# Patient Record
Sex: Male | Born: 2000 | Race: White | Hispanic: No | Marital: Single | Smoking: Never smoker
Health system: Southern US, Community
[De-identification: ages and names within clinical notes are randomized; demographics above are authoritative.]

---
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
## 2019-12-11 ENCOUNTER — Emergency Department (HOSPITAL_COMMUNITY)
Admission: EM | Admit: 2019-12-11 | Discharge: 2019-12-12 | Disposition: A | Discharge status: Home / Self Care | Payer: Medicaid Other | Attending: Emergency Medicine | Admitting: Emergency Medicine

## 2019-12-11 ENCOUNTER — Other Ambulatory Visit: Payer: Self-pay

## 2019-12-11 DIAGNOSIS — F329 Major depressive disorder, single episode, unspecified: Secondary | ICD-10-CM | POA: Diagnosis not present

## 2019-12-11 DIAGNOSIS — T50902A Poisoning by unspecified drugs, medicaments and biological substances, intentional self-harm, initial encounter: Secondary | ICD-10-CM | POA: Diagnosis present

## 2019-12-11 DIAGNOSIS — R45851 Suicidal ideations: Secondary | ICD-10-CM | POA: Diagnosis not present

## 2019-12-11 DIAGNOSIS — Z20828 Contact with and (suspected) exposure to other viral communicable diseases: Secondary | ICD-10-CM | POA: Diagnosis not present

## 2019-12-11 LAB — CBG MONITORING, ED: Glucose-Capillary: 88 mg/dL (ref 70–99)

## 2019-12-11 MED ORDER — CHARCOAL ACTIVATED PO LIQD
50.0000 g | Freq: Once | ORAL | Status: AC
  Administered 2019-12-11: 50 g via ORAL
  Filled 2019-12-11: qty 240

## 2019-12-11 NOTE — ED Notes (Signed)
Poison control notified, recommended charcoal and to monitor for 6-8 hours. Potential for sedation, nausea, HTN, QTC prolongation and seizures. Check a tylenol level as well and if QTC is >500 recommended optimizing K and Mg and can give benzos for seizures. Spoke with Patty.

## 2019-12-11 NOTE — ED Notes (Signed)
Pt made aware urine sample needed 

## 2019-12-11 NOTE — ED Triage Notes (Addendum)
Pt presents to ED from home stating he took about 1500 mg of fluoxetine in an attempt to kill himself around 2000 but then began to feel guilty for what it would do to his friends and family and decided to seek help. Denies attempt before and states he hasn't thrown any of them back up. Pt is ambulatory and CA&Ox4 at this time.

## 2019-12-11 NOTE — ED Provider Notes (Signed)
5:43 AM Assumed care from Dr. Wilson Singer, please see their note for full history, physical and decision making until this point. In brief this is a 18 y.o. year old male who presented to the ED tonight with Drug Overdose and Suicide Attempt     1500 mg prozac, charcoaled. Asymptomatic. 8 hour obs, 0400.   Still asymptomatic. Improving MS. Wants to go home but not forceable. Still no need for IVC, understands need to stay for further observation and TTS consult. Would IVC if still wants to leave.   Labs, studies and imaging reviewed by myself and considered in medical decision making if ordered. Imaging interpreted by radiology.  Labs Reviewed  ACETAMINOPHEN LEVEL - Abnormal; Notable for the following components:      Result Value   Acetaminophen (Tylenol), Serum <10 (*)    All other components within normal limits  RAPID URINE DRUG SCREEN, HOSP PERFORMED - Abnormal; Notable for the following components:   Tetrahydrocannabinol POSITIVE (*)    All other components within normal limits  RESPIRATORY PANEL BY RT PCR (FLU A&B, COVID)  COMPREHENSIVE METABOLIC PANEL  ETHANOL  SALICYLATE LEVEL  CBC  MAGNESIUM  CBG MONITORING, ED    No orders to display    No follow-ups on file.    Shyloh Krinke, Corene Cornea, MD 12/12/19 [phone]

## 2019-12-11 NOTE — ED Provider Notes (Signed)
[facility] DEPT Provider Note   CSN: [id number] Arrival date & time: 12/11/19  2044     History Chief Complaint  Patient presents with  . Drug Overdose  . Suicide Attempt    John Mcdaniel is a 18 y.o. male.  HPI   18 year old male with intentional drug overdose.  He estimates that he took approximately 1500 mg of fluoxetine.  Time of ingestion around 8:00.  He began to feel guilty about the repercussions so then told family.  States that he feels mildly lightheaded but is otherwise asymptomatic.  He denies any other ingestions.  He is not on any other medications.  He reports that he is otherwise healthy.  Reports increasing stress related to school.  No past medical history on file.  There are no problems to display for this patient.   No past surgical history on file.     No family history on file.  Social History   Tobacco Use  . Smoking status: Never Smoker  . Smokeless tobacco: Never Used  Substance Use Topics  . Alcohol use: No  . Drug use: No    Home Medications Prior to Admission medications   Medication Sig Start Date End Date Taking? Authorizing Provider  cariprazine (VRAYLAR) capsule Take 1.5 mg by mouth daily.   Yes [provider]  FLUoxetine (PROZAC) 40 MG capsule Take 40 mg by mouth daily. 09/19/19  Yes [provider]  PRESCRIPTION MEDICATION Take 1 tablet by mouth daily. Tremor Medication   Yes [provider]    Allergies    Other and Peanuts [peanut oil]  Review of Systems   Review of Systems All systems reviewed and negative, other than as noted in HPI.  Physical Exam Updated Vital Signs BP 108/72   Pulse 85   Temp 99.7 F (37.6 C) (Oral)   Resp 11   Ht 5\' 5"  (1.651 m)   Wt 59 kg   SpO2 100%   BMI 21.63 kg/m   Physical Exam Vitals and nursing note reviewed.  Constitutional:      General: He is not in acute distress.    Appearance: He is well-developed.  HENT:     Head: Normocephalic and atraumatic.  Eyes:     General:        Right eye: No discharge.        Left eye: No discharge.     Conjunctiva/sclera: Conjunctivae normal.  Cardiovascular:     Rate and Rhythm: Normal rate and regular rhythm.     Heart sounds: Normal heart sounds. No murmur. No friction rub. No gallop.   Pulmonary:     Effort: Pulmonary effort is normal. No respiratory distress.     Breath sounds: Normal breath sounds.  Abdominal:     General: There is no distension.     Palpations: Abdomen is soft.     Tenderness: There is no abdominal tenderness.  Musculoskeletal:        General: No tenderness.     Cervical back: Neck supple.  Skin:    General: Skin is warm and dry.  Neurological:     Mental Status: He is alert.  Psychiatric:     Comments: Calm. Cooperative. Fair eye contact. Flat affect.      ED Results / Procedures / Treatments   Labs (all labs ordered are listed, but only abnormal results are displayed) Labs Reviewed  RESPIRATORY PANEL BY RT PCR (FLU A&B, COVID)  COMPREHENSIVE METABOLIC PANEL  ETHANOL  SALICYLATE LEVEL  ACETAMINOPHEN LEVEL  CBC  RAPID URINE DRUG SCREEN, HOSP PERFORMED  MAGNESIUM  CBG MONITORING, ED    EKG EKG Interpretation  Date/Time:  Wednesday December 11 2019 21:17:43 EST Ventricular Rate:  82 PR Interval:    QRS Duration: 87 QT Interval:  354 QTC Calculation: 414 R Axis:   84 Text Interpretation: Sinus rhythm Confirmed by Raeford Razor [phone]) on 12/11/2019 10:15:24 PM   Radiology No results found.  Procedures Procedures (including critical care time)  CRITICAL CARE Performed by: Raeford Razor Total critical care time: 35 minutes Critical care time was exclusive of separately billable procedures and treating other patients. Critical care was necessary to treat or prevent imminent or life-threatening deterioration. Critical care was time spent personally by me on the following activities: development of treatment  plan with patient and/or surrogate as well as nursing, discussions with consultants, evaluation of patient's response to treatment, examination of patient, obtaining history from patient or surrogate, ordering and performing treatments and interventions, ordering and review of laboratory studies, ordering and review of radiographic studies, pulse oximetry and re-evaluation of patient's condition.   Medications Ordered in ED Medications  charcoal activated (NO SORBITOL) (ACTIDOSE-AQUA) suspension 50 g (50 g Oral Given 12/11/19 2108)    ED Course  I have reviewed the triage vital signs and the nursing notes.  Pertinent labs & imaging results that were available during my care of the patient were reviewed by me and considered in my medical decision making (see chart for details).    MDM Rules/Calculators/A&P                      18yM with intentional drug overdose. Thankfully, SSRIs general well tolerated even in massive overdoses. Very little in terms of symptoms. Charcoal given with timing of ingestion. HD stable. Initial EKG ok. Will continue to monitor. Will be medically cleared if no significant symptoms by 4:00 am and labs are fine.    Final Clinical Impression(s) / ED Diagnoses Final diagnoses:  Intentional drug overdose, initial encounter [facility])  Suicidal ideation    Rx / DC Orders ED Discharge Orders    None       Raeford Razor, MD 12/11/19 2241

## 2019-12-12 ENCOUNTER — Other Ambulatory Visit: Payer: Self-pay | Admitting: Behavioral Health

## 2019-12-12 ENCOUNTER — Observation Stay (HOSPITAL_COMMUNITY)
Admission: AD | Admit: 2019-12-12 | Discharge: 2019-12-12 | Disposition: A | Discharge status: Other Acute Inpatient Hospital | Payer: Medicaid Other | Source: Intra-hospital | Attending: Psychiatry | Admitting: Psychiatry

## 2019-12-12 ENCOUNTER — Inpatient Hospital Stay (HOSPITAL_COMMUNITY)
Admission: AD | Admit: 2019-12-12 | Discharge: 2019-12-14 | DRG: 885 | Disposition: A | Discharge status: Home / Self Care | Payer: Medicaid Other | Source: Intra-hospital | Attending: Psychiatry | Admitting: Psychiatry

## 2019-12-12 ENCOUNTER — Other Ambulatory Visit: Payer: Self-pay

## 2019-12-12 ENCOUNTER — Encounter (HOSPITAL_COMMUNITY): Payer: Self-pay | Admitting: Psychiatry

## 2019-12-12 DIAGNOSIS — F419 Anxiety disorder, unspecified: Secondary | ICD-10-CM | POA: Diagnosis present

## 2019-12-12 DIAGNOSIS — T43222A Poisoning by selective serotonin reuptake inhibitors, intentional self-harm, initial encounter: Secondary | ICD-10-CM | POA: Diagnosis present

## 2019-12-12 DIAGNOSIS — F332 Major depressive disorder, recurrent severe without psychotic features: Secondary | ICD-10-CM

## 2019-12-12 DIAGNOSIS — G4700 Insomnia, unspecified: Secondary | ICD-10-CM | POA: Diagnosis present

## 2019-12-12 DIAGNOSIS — T50902A Poisoning by unspecified drugs, medicaments and biological substances, intentional self-harm, initial encounter: Secondary | ICD-10-CM | POA: Diagnosis not present

## 2019-12-12 DIAGNOSIS — F329 Major depressive disorder, single episode, unspecified: Secondary | ICD-10-CM | POA: Diagnosis present

## 2019-12-12 LAB — MAGNESIUM: Magnesium: 2.2 mg/dL (ref 1.7–2.4)

## 2019-12-12 LAB — RESPIRATORY PANEL BY RT PCR (FLU A&B, COVID)
Influenza A by PCR: NEGATIVE
Influenza B by PCR: NEGATIVE
SARS Coronavirus 2 by RT PCR: NEGATIVE

## 2019-12-12 LAB — COMPREHENSIVE METABOLIC PANEL
ALT: 15 U/L (ref 0–44)
AST: 20 U/L (ref 15–41)
Albumin: 4.7 g/dL (ref 3.5–5.0)
Alkaline Phosphatase: 90 U/L (ref 38–126)
Anion gap: 12 (ref 5–15)
BUN: 9 mg/dL (ref 6–20)
CO2: 27 mmol/L (ref 22–32)
Calcium: 9.4 mg/dL (ref 8.9–10.3)
Chloride: 102 mmol/L (ref 98–111)
Creatinine, Ser: 0.61 mg/dL (ref 0.61–1.24)
GFR calc Af Amer: >60 mL/min (ref >60)
GFR calc non Af Amer: >60 mL/min (ref >60)
Glucose, Bld: 91 mg/dL (ref 70–99)
Potassium: 3.7 mmol/L (ref 3.5–5.1)
Sodium: 141 mmol/L (ref 135–145)
Total Bilirubin: 0.9 mg/dL (ref 0.3–1.2)
Total Protein: 7.8 g/dL (ref 6.5–8.1)

## 2019-12-12 LAB — CBC
HCT: 43 % (ref 39.0–52.0)
Hemoglobin: 14.8 g/dL (ref 13.0–17.0)
MCH: 30.1 pg (ref 26.0–34.0)
MCHC: 34.4 g/dL (ref 30.0–36.0)
MCV: 87.6 fL (ref 80.0–100.0)
Platelets: 270 10*3/uL (ref 150–400)
RBC: 4.91 MIL/uL (ref 4.22–5.81)
RDW: 11.8 % (ref 11.5–15.5)
WBC: 7.1 10*3/uL (ref 4.0–10.5)
nRBC: 0 % (ref 0.0–0.2)

## 2019-12-12 LAB — ETHANOL: Alcohol, Ethyl (B): <10 mg/dL (ref <10)

## 2019-12-12 LAB — ACETAMINOPHEN LEVEL: Acetaminophen (Tylenol), Serum: <10 ug/mL — ABNORMAL LOW (ref 10–30)

## 2019-12-12 LAB — RAPID URINE DRUG SCREEN, HOSP PERFORMED
Amphetamines: NOT DETECTED
Barbiturates: NOT DETECTED
Benzodiazepines: NOT DETECTED
Cocaine: NOT DETECTED
Opiates: NOT DETECTED
Tetrahydrocannabinol: POSITIVE — AB

## 2019-12-12 LAB — SALICYLATE LEVEL: Salicylate Lvl: <7 mg/dL (ref 2.8–30.0)

## 2019-12-12 MED ORDER — ONDANSETRON HCL 4 MG PO TABS: 4.0000 mg | ORAL_TABLET | Freq: Three times a day (TID) | ORAL | Status: DC | PRN

## 2019-12-12 MED ORDER — ALUM & MAG HYDROXIDE-SIMETH 200-200-20 MG/5ML PO SUSP: 30.0000 mL | ORAL | Status: DC | PRN

## 2019-12-12 MED ORDER — MAGNESIUM HYDROXIDE 400 MG/5ML PO SUSP: 30.0000 mL | Freq: Every day | ORAL | Status: DC | PRN

## 2019-12-12 MED ORDER — ACETAMINOPHEN 325 MG PO TABS: 650.0000 mg | ORAL_TABLET | ORAL | Status: DC | PRN

## 2019-12-12 MED ORDER — LACTATED RINGERS IV BOLUS
1000.0000 mL | Freq: Once | INTRAVENOUS | Status: AC
  Administered 2019-12-12: 02:00:00 1000 mL via INTRAVENOUS

## 2019-12-12 MED ORDER — ACETAMINOPHEN 325 MG PO TABS: 650.0000 mg | ORAL_TABLET | Freq: Four times a day (QID) | ORAL | Status: DC | PRN

## 2019-12-12 MED ORDER — CARIPRAZINE HCL 1.5 MG PO CAPS: 1.5000 mg | ORAL_CAPSULE | Freq: Every day | ORAL | Status: DC

## 2019-12-12 MED ORDER — HYDROXYZINE HCL 25 MG PO TABS: 25.0000 mg | ORAL_TABLET | Freq: Three times a day (TID) | ORAL | Status: DC | PRN

## 2019-12-12 MED ORDER — CARIPRAZINE HCL 1.5 MG PO CAPS
1.5000 mg | ORAL_CAPSULE | Freq: Every day | ORAL | Status: DC
  Filled 2019-12-12 (×3): qty 1

## 2019-12-12 MED ORDER — TRAZODONE HCL 50 MG PO TABS: 50.0000 mg | ORAL_TABLET | Freq: Every evening | ORAL | Status: DC | PRN

## 2019-12-12 MED ORDER — FLUOXETINE HCL 20 MG PO CAPS
40.0000 mg | ORAL_CAPSULE | Freq: Every day | ORAL | Status: DC
  Filled 2019-12-12 (×3): qty 2

## 2019-12-12 MED ORDER — HYDROXYZINE HCL 25 MG PO TABS
25.0000 mg | ORAL_TABLET | Freq: Three times a day (TID) | ORAL | Status: DC | PRN
  Administered 2019-12-12: 25 mg via ORAL
  Filled 2019-12-12 (×2): qty 1

## 2019-12-12 MED ORDER — CARIPRAZINE HCL 1.5 MG PO CAPS
1.5000 mg | ORAL_CAPSULE | ORAL | Status: DC
  Administered 2019-12-12 – 2019-12-13 (×2): 1.5 mg via ORAL
  Filled 2019-12-12 (×5): qty 1

## 2019-12-12 MED ORDER — TRAZODONE HCL 50 MG PO TABS
50.0000 mg | ORAL_TABLET | Freq: Every evening | ORAL | Status: DC | PRN
  Administered 2019-12-13: 22:00:00 50 mg via ORAL
  Filled 2019-12-12: qty 1

## 2019-12-12 NOTE — ED Notes (Signed)
Spoke with Magda Paganini at [employer] [facility]. Patient is now cleared by Poison Control.

## 2019-12-12 NOTE — ED Notes (Signed)
Report called to [place] at [facility]. [facility] contacted to get patient.
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
## 2019-12-12 NOTE — BH Assessment (Signed)
Clinician contacted pt's father with the verbal consent from pt to obtain collateral information. Pt's father shares the incident with pt yesterday came abruptly and that, 30-60 minutes prior, pt "seemed fine." Pt's father states he is not worried about pt being a danger to himself, as he and his wife do not work and they are able to ensure pt's safety. Pt's father was unable to identify any triggers that could have resulted in pt attempting to kill himself, though he was able to identify that pt being cooped up has been difficult for him. He states pt did mention that he has been thinking about his mother, who died in May 09, 2011 by suicide.  Pt's father noted that, when pt came to tell him that he had taken too much medication, he did not express any distress or that he was in any hurry regarding what he did. Pt's father states pt told him, "I messed up--I think I took too much of my medicine."  Pt's father shares pt takes Fluoxetine 40mg  and Hydroxyzine 10mg   Leoncio Hansen, pt's father: [phone]
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
## 2019-12-12 NOTE — Progress Notes (Signed)
   12/12/19 2032  Psych Admission Type (Psych Patients Only)  Admission Status Voluntary  Psychosocial Assessment  Patient Complaints None  Eye Contact Brief  Facial Expression Flat  Affect Anxious;Sullen  Speech Logical/coherent  Interaction Assertive  Motor Activity Other (Comment) (WNL)  Appearance/Hygiene Unremarkable  Behavior Characteristics Cooperative;Anxious  Thought Process  Coherency WDL  Content WDL  Delusions WDL;None reported or observed  Perception WDL  Hallucination None reported or observed  Confusion None  Danger to Self  Current suicidal ideation? Denies  Danger to Others  Danger to Others None reported or observed   Pt is calm and pleasant. Pt asks for something for sleep. Given meds and also nighttime meds. Pt denies SI,HI, AVH and pain.

## 2019-12-12 NOTE — Plan of Care (Signed)
  Problem: Education: Goal: Emotional status will improve Outcome: Progressing Goal: Mental status will improve Outcome: Progressing   Problem: Activity: Goal: Interest or engagement in activities will improve Outcome: Progressing Goal: Sleeping patterns will improve Outcome: Progressing   Problem: Safety: Goal: Periods of time without injury will increase Outcome: Progressing   

## 2019-12-12 NOTE — ED Notes (Addendum)
Spoke with patient's father regarding transfer to [facility]. Per patients request, writer Gave room # and phone # for patient at [facility] to the father.

## 2019-12-12 NOTE — ED Notes (Signed)
Writer obtained consent for [facility], faxed over to [facility]

## 2019-12-12 NOTE — Tx Team (Signed)
Initial Treatment Plan 12/12/2019 3:51 PM [name] EYE:[id number]    PATIENT STRESSORS: Health problems Medication change or noncompliance Occupational concerns   PATIENT STRENGTHS: Ability for insight Average or above average intelligence Communication skills Supportive family/friends   PATIENT IDENTIFIED PROBLEMS: "Work on myself"  "Not have have SI thoughts"  Depression  Suicidal Ideation               DISCHARGE CRITERIA:  Ability to meet basic life and health needs Adequate post-discharge living arrangements Motivation to continue treatment in a less acute level of care  PRELIMINARY DISCHARGE PLAN: Attend aftercare/continuing care group Outpatient therapy Return to previous living arrangement  PATIENT/FAMILY INVOLVEMENT: This treatment plan has been presented to and reviewed with the patient, John Mcdaniel, and/or family member.  The patient and family have been given the opportunity to ask questions and make suggestions.  Coralyn Mark [name], RN 12/12/2019, 3:51 PM

## 2019-12-12 NOTE — Progress Notes (Signed)
Admission Note: Patient is a 18 year old male admitted to the observation unit for intentional overdose on his fluoxetine.  Reports being overwhelmed with school and worrying about his future.  Patient currently denies suicidal ideation and verbally contracts for safety while in the hospital.  States goal is working on himself and to stop having suicidal thoughts.  Admission plan of care reviewed and consent signed.  Skin assessment and personal belongings completed.  Skin is dry and intact.  No contraband found.  Patient oriented to the unit, staff and room.  Verbalizes understanding of unit rules and protocol.  Patient is safe on the unit.

## 2019-12-12 NOTE — ED Notes (Signed)
ED TO INPATIENT HANDOFF REPORT  ED Nurse Name and Phone #:  Fahima Cifelli [phone]  S Name/Age/Gender Forestine Na 18 y.o. male Room/Bed: WA29/WA29  Code Status   Code Status: Full Code  Home/SNF/Other Home Patient oriented to: self, place, time and situation Is this baseline? Yes   Triage Complete: Triage complete  Chief Complaint Medication Overdose  Triage Note Pt presents to ED from home stating he took about 1500 mg of fluoxetine in an attempt to kill himself around 2000 but then began to feel guilty for what it would do to his friends and family and decided to seek help. Denies attempt before and states he hasn't thrown any of them back up. Pt is ambulatory and CA&Ox4 at this time.    Allergies Allergies  Allergen Reactions  . Other Other (See Comments)    Tree nuts  . Peanuts [Peanut Oil] Other (See Comments)    Unknown    Level of Care/Admitting Diagnosis ED Disposition    ED Disposition Condition Comment   Transfer to Another Facility  Transfer to [facility] 401-1      B Medical/Surgery History No past medical history on file. No past surgical history on file.   A IV Location/Drains/Wounds Patient Lines/Drains/Airways Status   Active Line/Drains/Airways    None          Intake/Output Last 24 hours No intake or output data in the 24 hours ending 12/12/19 1118  Labs/Imaging Results for orders placed or performed during the hospital encounter of 12/11/19 (from the past 48 hour(s))  Comprehensive metabolic panel     Status: None   Collection Time: 12/11/19  8:53 PM  Result Value Ref Range   Sodium 141 135 - 145 mmol/L   Potassium 3.7 3.5 - 5.1 mmol/L   Chloride 102 98 - 111 mmol/L   CO2 27 22 - 32 mmol/L   Glucose, Bld 91 70 - 99 mg/dL   BUN 9 6 - 20 mg/dL   Creatinine, Ser 0.61 0.61 - 1.24 mg/dL   Calcium 9.4 8.9 - 10.3 mg/dL   Total Protein 7.8 6.5 - 8.1 g/dL   Albumin 4.7 3.5 - 5.0 g/dL   AST 20 15 - 41 U/L   ALT 15 0 - 44 U/L   Alkaline Phosphatase 90 38 - 126 U/L   Total Bilirubin 0.9 0.3 - 1.2 mg/dL   GFR calc non Af Amer >60 >60 mL/min   GFR calc Af Amer >60 >60 mL/min   Anion gap 12 5 - 15    Comment: Performed at [facility], [place] [address], [place], [place] [address]  Ethanol     Status: None   Collection Time: 12/11/19  8:53 PM  Result Value Ref Range   Alcohol, Ethyl (B) <10 <10 mg/dL    Comment: (NOTE) Lowest detectable limit for serum alcohol is 10 mg/dL. For medical purposes only. Performed at [facility], [place] [address], [place], [place] [address]   Salicylate level     Status: None   Collection Time: 12/11/19  8:53 PM  Result Value Ref Range   Salicylate Lvl <0.0 2.8 - 30.0 mg/dL    Comment: Performed at [facility], [place] [address] [place], [place] [address]  Acetaminophen level     Status: Abnormal   Collection Time: 12/11/19  8:53 PM  Result Value Ref Range   Acetaminophen (Tylenol), Serum <10 (L) 10 - 30 ug/mL    Comment: (  NOTE) Therapeutic concentrations vary significantly. A range of 10-30 ug/mL  may be an effective concentration for many patients. However, some  are best treated at concentrations outside of this range. Acetaminophen concentrations >150 ug/mL at 4 hours after ingestion  and >50 ug/mL at 12 hours after ingestion are often associated with  toxic reactions. Performed at [facility], [address] [place], [place] [address]   cbc     Status: None   Collection Time: 12/11/19  8:53 PM  Result Value Ref Range   WBC 7.1 4.0 - 10.5 K/uL   RBC 4.91 4.22 - 5.81 MIL/uL   Hemoglobin 14.8 13.0 - 17.0 g/dL   HCT 60.443.0 54.039.0 - 98.152.0 %   MCV 87.6 80.0 - 100.0 fL   MCH 30.1 26.0 - 34.0 pg   MCHC 34.4 30.0 - 36.0 g/dL   RDW 19.111.8 47.811.5 - 29.515.5 %   Platelets 270 150 - 400 K/uL   nRBC 0.0 0.0 - 0.2 %    Comment: Performed at [facility], [address]  [place], [place] [address]  Rapid urine drug screen (hospital performed)     Status: Abnormal   Collection Time: 12/11/19  8:53 PM  Result Value Ref Range   Opiates NONE DETECTED NONE DETECTED   Cocaine NONE DETECTED NONE DETECTED   Benzodiazepines NONE DETECTED NONE DETECTED   Amphetamines NONE DETECTED NONE DETECTED   Tetrahydrocannabinol POSITIVE (A) NONE DETECTED   Barbiturates NONE DETECTED NONE DETECTED    Comment: (NOTE) DRUG SCREEN FOR MEDICAL PURPOSES ONLY.  IF CONFIRMATION IS NEEDED FOR ANY PURPOSE, NOTIFY LAB WITHIN 5 DAYS. LOWEST DETECTABLE LIMITS FOR URINE DRUG SCREEN Drug Class                     Cutoff (ng/mL) Amphetamine and metabolites    1000 Barbiturate and metabolites    200 Benzodiazepine                 200 Tricyclics and metabolites     300 Opiates and metabolites        300 Cocaine and metabolites        300 THC                            50 Performed at [facility], [address] [place], [place] [address]   CBG monitoring, ED     Status: None   Collection Time: 12/11/19  9:10 PM  Result Value Ref Range   Glucose-Capillary 88 70 - 99 mg/dL  Magnesium     Status: None   Collection Time: 12/11/19 10:16 PM  Result Value Ref Range   Magnesium 2.2 1.7 - 2.4 mg/dL    Comment: Performed at [facility], [address] [place], [place] [address]  Respiratory Panel by RT PCR (Flu A&B, Covid) - Nasopharyngeal Swab     Status: None   Collection Time: 12/11/19 10:16 PM   Specimen: Nasopharyngeal Swab  Result Value Ref Range   SARS Coronavirus 2 by RT PCR NEGATIVE NEGATIVE    Comment: (NOTE) SARS-CoV-2 target nucleic acids are NOT DETECTED. The SARS-CoV-2 RNA is generally detectable in upper respiratoy specimens during the acute phase of infection. The lowest concentration of SARS-CoV-2 viral copies this assay can detect is 131 copies/mL. A negative result does not preclude SARS-Cov-2 infection and should not be used as  the sole basis for treatment  or other patient management decisions. A negative result may occur with  improper specimen collection/handling, submission of specimen other than nasopharyngeal swab, presence of viral mutation(s) within the areas targeted by this assay, and inadequate number of viral copies (<131 copies/mL). A negative result must be combined with clinical observations, patient history, and epidemiological information. The expected result is Negative. Fact Sheet for Patients:  https://www.moore.com/ Fact Sheet for Healthcare Providers:  https://www.young.biz/ This test is not yet ap proved or cleared by the [place] FDA and  has been authorized for detection and/or diagnosis of SARS-CoV-2 by FDA under an Emergency Use Authorization (EUA). This EUA will remain  in effect (meaning this test can be used) for the duration of the COVID-19 declaration under Section 564(b)(1) of the Act, 21 U.S.C. section 360bbb-3(b)(1), unless the authorization is terminated or revoked sooner.    Influenza A by PCR NEGATIVE NEGATIVE   Influenza B by PCR NEGATIVE NEGATIVE    Comment: (NOTE) The Xpert Xpress SARS-CoV-2/FLU/RSV assay is intended as an aid in  the diagnosis of influenza from Nasopharyngeal swab specimens and  should not be used as a sole basis for treatment. Nasal washings and  aspirates are unacceptable for Xpert Xpress SARS-CoV-2/FLU/RSV  testing. Fact Sheet for Patients: https://www.moore.com/ Fact Sheet for Healthcare Providers: https://www.young.biz/ This test is not yet approved or cleared by the [place] FDA and  has been authorized for detection and/or diagnosis of SARS-CoV-2 by  FDA under an Emergency Use Authorization (EUA). This EUA will remain  in effect (meaning this test can be used) for the duration of the  Covid-19 declaration under Section 564(b)(1) of the Act, 21  U.S.C.  section 360bbb-3(b)(1), unless the authorization is  terminated or revoked. Performed at [facility], [address], [place], [place] [address]    No results found.  Pending Labs Unresulted Labs (From admission, onward)   None      Vitals/Pain Today's Vitals   12/12/19 0130 12/12/19 0238 12/12/19 0328 12/12/19 0637  BP: 99/64 118/68  (!) 100/51  Pulse: 69 73  71  Resp: 18 16  16   Temp:  98.1 F (36.7 C)  98.3 F (36.8 C)  TempSrc:  Oral  Oral  SpO2: 96% 100%  98%  Weight:      Height:      PainSc:   0-No pain     Isolation Precautions No active isolations  Medications Medications  acetaminophen (TYLENOL) tablet 650 mg (has no administration in time range)  ondansetron (ZOFRAN) tablet 4 mg (has no administration in time range)  charcoal activated (NO SORBITOL) (ACTIDOSE-AQUA) suspension 50 g (50 g Oral Given 12/11/19 2108)  lactated ringers bolus 1,000 mL (0 mLs Intravenous Stopped 12/12/19 0230)    Mobility walks Low fall risk   Focused Assessments    R Recommendations: See Admitting Provider Note  Report given to: 12/14/19   Additional Notes:

## 2019-12-12 NOTE — BH Assessment (Signed)
[place] Assessment Progress Note  Per Letitia Libra, FNP, this pt would benefit from admission to the [facility] Observation Unit at this time.  She reports that pt has been assigned pt to Obs Rm 401-1.  Pt's nurse, Melody, is aware of this.  She has had pt sign Voluntary Admission and Consent for Treatment, and has faxed it to [facility].  She agrees to send original paperwork along with pt via [employer], and to call report to [phone].  Jalene Mullet, Electra Coordinator [phone]

## 2019-12-13 DIAGNOSIS — F332 Major depressive disorder, recurrent severe without psychotic features: Principal | ICD-10-CM

## 2019-12-13 LAB — LIPID PANEL
Cholesterol: 149 mg/dL (ref 0–200)
HDL: 48 mg/dL (ref >40)
LDL Cholesterol: 87 mg/dL (ref 0–99)
Total CHOL/HDL Ratio: 3.1 RATIO
Triglycerides: 70 mg/dL (ref <150)
VLDL: 14 mg/dL (ref 0–40)

## 2019-12-13 LAB — HEMOGLOBIN A1C
Hgb A1c MFr Bld: 5.1 % (ref 4.8–5.6)
Mean Plasma Glucose: 99.67 mg/dL

## 2019-12-13 LAB — TSH: TSH: 1.004 u[IU]/mL (ref 0.350–4.500)

## 2019-12-13 NOTE — Progress Notes (Signed)
   12/13/19 1100  Psych Admission Type (Psych Patients Only)  Admission Status Voluntary  Psychosocial Assessment  Patient Complaints None  Eye Contact Brief  Facial Expression Flat  Affect Anxious;Sullen  Speech Logical/coherent  Interaction Assertive  Motor Activity Other (Comment) (WNL)  Appearance/Hygiene Unremarkable  Behavior Characteristics Cooperative  Mood Depressed  Thought Process  Coherency WDL  Content WDL  Delusions WDL;None reported or observed  Perception WDL  Hallucination None reported or observed  Judgment Poor  Confusion None  Danger to Self  Current suicidal ideation? Denies  Danger to Others  Danger to Others None reported or observed
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
## 2019-12-13 NOTE — BHH Group Notes (Signed)
Type of Therapy/Topic: Identifying Irrational Beliefs/Thoughts  Participation Level: Active  Description of Group: The purpose of this group is to assist patients in learning to identify irrational beliefs and thoughts that contribute to their negative emotions and experience positive emotions. Patients will be guided to discuss ways in which they have been effected by irrational thoughts and beliefs and how to transform those irrational beliefs into rational ones. Newly identified rational beliefs will be juxtaposed with experiences of positive emotions or situations, and patients will be challenged to use rational beliefs or thoughts to combat negative ones. Special emphasis will be placed on coping with irrational beliefs in conflict situations, and patients will process healthy conflict resolution skills.  Therapeutic Goals: 1. Patient will identify two irrational thoughts or beliefs  to reflect on in order to balance out those thoughts 2. Patient will label two or more irrational thoughts/beliefs that they find the most difficult to cope with 3. Patient will demonstrate positive conflict resolution skills through discussion and/or role plays that will assist in transforming irrational thoughts or beliefs into positive ones.  Summary of Patient Progress:  Attended group and participated throughout the group session. Worksheet completed.    Therapeutic Modalities: Cognitive Behavioral Therapy Feelings Identification Dialectical Behavioral Therapy   

## 2019-12-13 NOTE — Progress Notes (Signed)
Recreation Therapy Notes  Date:  12.18.20 Time: 0930 Location: 300 Hall Dayroom  Group Topic: Stress Management  Goal Area(s) Addresses:  Patient will identify positive stress management techniques. Patient will identify benefits of using stress management post d/c.  Intervention: Stress Management  Activity :  Meditation.  LRT played a meditation that focused on making the most of your day.  Patients were to listen as meditation played to follow along.  Education:  Stress Management, Discharge Planning.   Education Outcome: Acknowledges Education  Clinical Observations/Feedback: Pt did not attend group session.    Victorino Sparrow, LRT/CTRS         Ria Comment, Ronald Vinsant A 12/13/2019 11:50 AM

## 2019-12-13 NOTE — Progress Notes (Signed)
   12/13/19 2210  Psych Admission Type (Psych Patients Only)  Admission Status Voluntary  Psychosocial Assessment  Patient Complaints None  Eye Contact Fair  Facial Expression Flat  Affect Sullen  Speech Logical/coherent  Interaction Assertive  Motor Activity Other (Comment) (WNL)  Appearance/Hygiene Unremarkable  Behavior Characteristics Cooperative  Mood Sullen;Pleasant  Thought Process  Coherency WDL  Content WDL  Delusions None reported or observed  Perception WDL  Hallucination None reported or observed  Judgment Poor  Confusion None  Danger to Self  Current suicidal ideation? Denies  Danger to Others  Danger to Others None reported or observed   Pt sitting in the dayroom participating in wrap up group as well as interacting with others after group. Pt had visit from father today and said "it went great." Pt denies SI, HI, AVH and pain. Pt pleasant with flat affect. Pt given trazodone for sleep. Says that it helped last night.

## 2019-12-13 NOTE — Progress Notes (Signed)
[facility] MD Progress Note  12/13/2019 10:52 AM John Mcdaniel  MRN:  [id number]  Subjective: Hannan reports, "I'm here because I took too many Prozac pills, not really to kill myself, but a cry for help instead. After I took the pills, I went immediately & told my dad & we came to the hospital. I'm feeling a lot better today. What happened was I received my final grade for the semester & they were not good. I have had time talking with my father since coming to the hospital. He assured me that we can talk about anything & I can tell him anything that is bothering me from here on. I'm doing good now. I don't feel depressed or anxious. This is my first attempt on my life. I have had suicidal thoughts off & on, but never attempted until the other day".  Objective:Patient is a 18 year old male with a reported past psychiatric history significant for major depression who presented to the [facility] emergency department on 12/11/2019 after an intentional overdose of approximately 1500 mg of fluoxetine. The patient stated that he had been doing relatively well recently, but got his grades from college the day prior to this event. He had failed 2 classes, and was upset over the fact that he was going to have to repeat those classes. The notes from his father suggested that he was just doing fine and then the event occurred rather randomly and acutely. He stated that he had not had any recent suicidal thoughts prior to this event. He did admit that in the past he had some burning behaviors, but had never tried to kill himself in the past.  12-13-19, Daylin is seen, chart reviewed. The chart finding discussed with the treatment team. He prsents alert, oriented & aware of situation. He is visible on the unit, attending group sessions. He reports today an improved mood. He is taking & tolerating his medications. He denies any adverse effects or reactions. He says he is sleeping well at night.  Marlan is saying today that attempting to overdose on medications was a cry for help. He says he was not really thinking about killing himself. He adds that this incident has opened a good line of communication between him & his father. He says he knows now that he can not only talk to his father but can tell him anything that is bothering him. He currently denies any SIHI, AVH, delusional thoughts or paranoia. He does not appear to be responding to any internal stimuli. Cartrell is in agreement to continue his current plan of care as already in progress.  Principal Problem: MDD (major depressive disorder), recurrent episode, severe (HCC)  Diagnosis: Principal Problem:   MDD (major depressive disorder), recurrent episode, severe (HCC) Active Problems:   MDD (major depressive disorder)   Major depression  Total Time spent with patient: 25 minutes  Past Psychiatric History: Major depressive disorder, recurrent.  Past Medical History: History reviewed. No pertinent past medical history. History reviewed. No pertinent surgical history.  Family History: History reviewed. No pertinent family history.  Family Psychiatric  History: See H&P  Social History:  Social History   Substance and Sexual Activity  Alcohol Use No     Social History   Substance and Sexual Activity  Drug Use No    Social History   Socioeconomic History  . Marital status: Single    Spouse name: Not on file  . Number of children: Not on file  .  Years of education: Not on file  . Highest education level: Not on file  Occupational History  . Not on file  Tobacco Use  . Smoking status: Never Smoker  . Smokeless tobacco: Never Used  Substance and Sexual Activity  . Alcohol use: No  . Drug use: No  . Sexual activity: Not Currently  Other Topics Concern  . Not on file  Social History Narrative  . Not on file   Social Determinants of Health   Financial Resource Strain:   . Difficulty of Paying Living  Expenses: Not on file  Food Insecurity:   . Worried About Charity fundraiser in the Last Year: Not on file  . Ran Out of Food in the Last Year: Not on file  Transportation Needs:   . Lack of Transportation (Medical): Not on file  . Lack of Transportation (Non-Medical): Not on file  Physical Activity:   . Days of Exercise per Week: Not on file  . Minutes of Exercise per Session: Not on file  Stress:   . Feeling of Stress : Not on file  Social Connections:   . Frequency of Communication with Friends and Family: Not on file  . Frequency of Social Gatherings with Friends and Family: Not on file  . Attends Religious Services: Not on file  . Active Member of Clubs or Organizations: Not on file  . Attends Archivist Meetings: Not on file  . Marital Status: Not on file   Additional Social History:   Sleep: Good  Appetite:  Good  Current Medications: Current Facility-Administered Medications  Medication Dose Route Frequency Provider Last Rate Last Admin  . acetaminophen (TYLENOL) tablet 650 mg  650 mg Oral Q6H PRN Sharma Covert, MD      . alum & mag hydroxide-simeth (MAALOX/MYLANTA) 200-200-20 MG/5ML suspension 30 mL  30 mL Oral Q4H PRN Sharma Covert, MD      . cariprazine [facility]) capsule 1.5 mg  1.5 mg Oral Q24H Sharma Covert, MD   1.5 mg at 12/12/19 2011  . hydrOXYzine (ATARAX/VISTARIL) tablet 25 mg  25 mg Oral TID PRN Sharma Covert, MD   25 mg at 12/12/19 2010  . magnesium hydroxide (MILK OF MAGNESIA) suspension 30 mL  30 mL Oral Daily PRN Sharma Covert, MD      . traZODone (DESYREL) tablet 50 mg  50 mg Oral QHS PRN Sharma Covert, MD       Lab Results:  Results for orders placed or performed during the hospital encounter of 12/12/19 (from the past 48 hour(s))  Hemoglobin A1c     Status: None   Collection Time: 12/13/19  6:25 AM  Result Value Ref Range   Hgb A1c MFr Bld 5.1 4.8 - 5.6 %    Comment: (NOTE) Pre diabetes:           5.7%-6.4% Diabetes:              >6.4% Glycemic control for   <7.0% adults with diabetes    Mean Plasma Glucose 99.67 mg/dL    Comment: Performed at [facility], [place] [address] [place], [place] [address]  Lipid panel     Status: None   Collection Time: 12/13/19  6:25 AM  Result Value Ref Range   Cholesterol 149 0 - 200 mg/dL   Triglycerides 70 <150 mg/dL   HDL 48 >40 mg/dL   Total CHOL/HDL Ratio 3.1 RATIO   VLDL 14 0 - 40  mg/dL   LDL Cholesterol 87 0 - 99 mg/dL    Comment:        Total Cholesterol/HDL:CHD Risk Coronary Heart Disease Risk Table                     Men   Women  1/2 Average Risk   3.4   3.3  Average Risk       5.0   4.4  2 X Average Risk   9.6   7.1  3 X Average Risk  23.4   11.0        Use the calculated Patient Ratio above and the CHD Risk Table to determine the patient's CHD Risk.        ATP III CLASSIFICATION (LDL):  <100     mg/dL   Optimal  532-992  mg/dL   Near or Above                    Optimal  130-159  mg/dL   Borderline  426-834  mg/dL   High  >196     mg/dL   Very High Performed at [facility], [address], [place], [place] [address]   TSH     Status: None   Collection Time: 12/13/19  6:25 AM  Result Value Ref Range   TSH 1.004 0.350 - 4.500 uIU/mL    Comment: Performed by a 3rd Generation assay with a functional sensitivity of <=0.01 uIU/mL. Performed at [facility], [address] [place], [place] [address]    Blood Alcohol level:  Lab Results  Component Value Date   ETH <10 12/11/2019   Metabolic Disorder Labs: Lab Results  Component Value Date   HGBA1C 5.1 12/13/2019   MPG 99.67 12/13/2019   No results found for: PROLACTIN Lab Results  Component Value Date   CHOL 149 12/13/2019   TRIG 70 12/13/2019   HDL 48 12/13/2019   CHOLHDL 3.1 12/13/2019   VLDL 14 12/13/2019   LDLCALC 87 12/13/2019   Physical Findings: AIMS:  , ,  ,  ,    CIWA:    COWS:      Musculoskeletal: Strength & Muscle Tone: within normal limits Gait & Station: normal Patient leans: N/A  Psychiatric Specialty Exam: Physical Exam  Nursing note and vitals reviewed. Constitutional: He is oriented to person, place, and time. He appears well-developed.  Cardiovascular: Normal rate.  Respiratory: Effort normal.  Genitourinary:    Genitourinary Comments: Deferred   Musculoskeletal:        General: Normal range of motion.     Cervical back: Normal range of motion.  Neurological: He is alert and oriented to person, place, and time.  Skin: Skin is warm and dry.    Review of Systems  Constitutional: Negative for chills, diaphoresis and fever.  HENT: Negative for congestion, rhinorrhea, sneezing and sore throat.   Respiratory: Negative for cough, shortness of breath and wheezing.   Cardiovascular: Negative for chest pain and palpitations.  Gastrointestinal: Negative for diarrhea, nausea and vomiting.  Musculoskeletal: Negative for myalgias.  Skin: Negative for color change.  Neurological: Negative for seizures and numbness.  Psychiatric/Behavioral: Negative for agitation, behavioral problems, confusion, decreased concentration, dysphoric mood, hallucinations, self-injury, sleep disturbance and suicidal ideas. The patient is not nervous/anxious and is not hyperactive.     Blood pressure 104/61, pulse 79, temperature 98.9 F (37.2 C), temperature source Oral, resp. rate 16, height 5\' 5"  (1.651 m), weight 59  kg, SpO2 99 %.Body mass index is 21.63 kg/m.  General Appearance: Casual  Eye Contact: Good  Speech:  Normal Rate  Volume: Normal  Mood: "Improving", rates depression & anxiety at #2.  Affect: Appropriate, Congruent  Thought Process:  Coherent and Descriptions of Associations: Intact  Orientation:  Full (Time, Place, and Person)  Thought Content: Logical, denies any hallucinations, delusions or paranoia.  Suicidal Thoughts: Denies any thoughts, plans or intent.   Homicidal Thoughts: Denies any thoughts, plans or intent.  Memory:  Immediate; Good Recent; Good Remote;   Fair  Judgement:  Intact  Insight: Present  Psychomotor Activity: Normal  Concentration: Concentration: Good and Attention Span: Good  Recall: Good  Fund of Knowledge:  Good  Language:  Good  Akathisia:  Negative  Handed:  Right  AIMS (if indicated):     Assets:  Desire for Improvement Resilience, Social support.  ADL's:  Intact  Cognition:  WNL    Sleep:  Number of Hours: 6   Treatment Plan Summary: Daily contact with patient to assess and evaluate symptoms and progress in treatment and Medication management.  -Continue inpatient hospitalization.  -Will continue today 12/13/2019 plan as below except where it is noted.  -Mood control   -Continue Vraylar 1.5 Q 24 hours    -Anxiety  -Continue Vistaril 25 mg po q8h prn anxiety  -Insomnia  -Continue Trazodone 50 mg po prn qhs  -Encourage participation in groups and therapeutic milieu  -Disposition planning will be ongoing  Armandina StammerAgnes Nathaly Dawkins, NP, PMHNP, FNP-BC 12/13/2019, 10:52 AM

## 2019-12-13 NOTE — Tx Team (Signed)
Interdisciplinary Treatment and Diagnostic Plan Update  12/13/2019 Time of Session:  KODA ROUTON MRN: [id number]  Principal Diagnosis: <principal problem not specified>  Secondary Diagnoses: Active Problems:   MDD (major depressive disorder)   Major depression   Current Medications:  Current Facility-Administered Medications  Medication Dose Route Frequency Provider Last Rate Last Admin  . acetaminophen (TYLENOL) tablet 650 mg  650 mg Oral Q6H PRN Sharma Covert, MD      . alum & mag hydroxide-simeth (MAALOX/MYLANTA) 200-200-20 MG/5ML suspension 30 mL  30 mL Oral Q4H PRN Sharma Covert, MD      . cariprazine [facility]) capsule 1.5 mg  1.5 mg Oral Q24H Sharma Covert, MD   1.5 mg at 12/12/19 2011  . hydrOXYzine (ATARAX/VISTARIL) tablet 25 mg  25 mg Oral TID PRN Sharma Covert, MD   25 mg at 12/12/19 2010  . magnesium hydroxide (MILK OF MAGNESIA) suspension 30 mL  30 mL Oral Daily PRN Sharma Covert, MD      . traZODone (DESYREL) tablet 50 mg  50 mg Oral QHS PRN Sharma Covert, MD       PTA Medications: Medications Prior to Admission  Medication Sig Dispense Refill Last Dose  . cariprazine (VRAYLAR) capsule Take 1.5 mg by mouth daily.     Marland Kitchen FLUoxetine (PROZAC) 40 MG capsule Take 40 mg by mouth daily.     Marland Kitchen PRESCRIPTION MEDICATION Take 1 tablet by mouth daily. Tremor Medication       Patient Stressors:    Patient Strengths:    Treatment Modalities: Medication Management, Group therapy, Case management,  1 to 1 session with clinician, Psychoeducation, Recreational therapy.   Physician Treatment Plan for Primary Diagnosis: <principal problem not specified> Long Term Goal(s):     Short Term Goals:    Medication Management: Evaluate patient's response, side effects, and tolerance of medication regimen.  Therapeutic Interventions: 1 to 1 sessions, Unit Group sessions and Medication administration.  Evaluation of Outcomes: Not Met  Physician  Treatment Plan for Secondary Diagnosis: Active Problems:   MDD (major depressive disorder)   Major depression  Long Term Goal(s):     Short Term Goals:       Medication Management: Evaluate patient's response, side effects, and tolerance of medication regimen.  Therapeutic Interventions: 1 to 1 sessions, Unit Group sessions and Medication administration.  Evaluation of Outcomes: Not Met   RN Treatment Plan for Primary Diagnosis: <principal problem not specified> Long Term Goal(s): Knowledge of disease and therapeutic regimen to maintain health will improve  Short Term Goals: Ability to participate in decision making will improve, Ability to verbalize feelings will improve, Ability to disclose and discuss suicidal ideas, Ability to identify and develop effective coping behaviors will improve and Compliance with prescribed medications will improve  Medication Management: RN will administer medications as ordered by provider, will assess and evaluate patient's response and provide education to patient for prescribed medication. RN will report any adverse and/or side effects to prescribing provider.  Therapeutic Interventions: 1 on 1 counseling sessions, Psychoeducation, Medication administration, Evaluate responses to treatment, Monitor vital signs and CBGs as ordered, Perform/monitor CIWA, COWS, AIMS and Fall Risk screenings as ordered, Perform wound care treatments as ordered.  Evaluation of Outcomes: Not Met   LCSW Treatment Plan for Primary Diagnosis: <principal problem not specified> Long Term Goal(s): Safe transition to appropriate next level of care at discharge, Engage patient in therapeutic group addressing interpersonal concerns.  Short Term Goals: Engage patient in  aftercare planning with referrals and resources  Therapeutic Interventions: Assess for all discharge needs, 1 to 1 time with Social worker, Explore available resources and support systems, Assess for adequacy in  community support network, Educate family and significant other(s) on suicide prevention, Complete Psychosocial Assessment, Interpersonal group therapy.  Evaluation of Outcomes: Not Met   Progress in Treatment: Attending groups: No. Participating in groups: No. Taking medication as prescribed: Yes. Toleration medication: Yes. Family/Significant other contact made: No, will contact:  if patient consents to collateral contacts Patient understands diagnosis: Yes. Discussing patient identified problems/goals with staff: Yes. Medical problems stabilized or resolved: Yes. Denies suicidal/homicidal ideation: Yes. Issues/concerns per patient self-inventory: No. Other:   New problem(s) identified: None   New Short Term/Long Term Goal(s):Detox, medication stabilization, elimination of SI thoughts, development of comprehensive mental wellness plan.   Patient Goals:    Discharge Plan or Barriers: Patient recently admitted. CSW will continue to follow and assess for appropriate referrals and possible discharge planning.    Reason for Continuation of Hospitalization: Depression Medication stabilization Suicidal ideation  Estimated Length of Stay: 3-5 days   Attendees: Patient: 12/13/2019 9:08 AM  Physician: Dr. Myles Lipps, MD 12/13/2019 9:08 AM  Nursing: Benjamine Mola.Jenetta Downer, RN 12/13/2019 9:08 AM  RN Care Manager: 12/13/2019 9:08 AM  Social Worker: Radonna Ricker, LCSW 12/13/2019 9:08 AM  Recreational Therapist:  12/13/2019 9:08 AM  Other:  12/13/2019 9:08 AM  Other:  12/13/2019 9:08 AM  Other: 12/13/2019 9:08 AM    Scribe for Treatment Team: Marylee Floras, [place] 12/13/2019 9:08 AM

## 2019-12-14 MED ORDER — TRAZODONE HCL 50 MG PO TABS: 50.0000 mg | ORAL_TABLET | Freq: Every evening | ORAL | 0 refills | Status: AC | PRN

## 2019-12-14 MED ORDER — CARIPRAZINE HCL 1.5 MG PO CAPS: 1.5000 mg | ORAL_CAPSULE | ORAL | 0 refills | Status: AC

## 2019-12-14 MED ORDER — HYDROXYZINE HCL 25 MG PO TABS: 25.0000 mg | ORAL_TABLET | Freq: Three times a day (TID) | ORAL | 0 refills | Status: AC | PRN

## 2019-12-14 NOTE — BHH Suicide Risk Assessment (Signed)
[place] INPATIENT:  Family/Significant Other Suicide Prevention Education  Suicide Prevention Education:  Education Completed; Latavion Halls [phone],  (name of family member/significant other) has been identified by the patient as the family member/significant other with whom the patient will be residing, and identified as the person(s) who will aid the patient in the event of a mental health crisis (suicidal ideations/suicide attempt).  With written consent from the patient, the family member/significant other has been provided the following suicide prevention education, prior to the and/or following the discharge of the patient.  Father had questions about patient's follow-up and stated the patient has done well in the past at [facility].  He is willing to take him to [place] on Tuesday morning, and states they plan to do some family counseling as well.  He is very involved and plans to do daily check-ins with patient about his mental health and review his discharge paperwork carefully.  The suicide prevention education provided includes the following:  Suicide risk factors  Suicide prevention and interventions  National Suicide Hotline telephone number  [facility] assessment telephone number  [facility] Emergency Assistance [place] and/or Residential Mobile Crisis Unit telephone number  Request made of family/significant other to:  Remove weapons (e.g., guns, rifles, knives), all items previously/currently identified as safety concern.    Remove drugs/medications (over-the-counter, prescriptions, illicit drugs), all items previously/currently identified as a safety concern.  The family member/significant other verbalizes understanding of the suicide prevention education information provided.  The family member/significant other agrees to remove the items of safety concern listed above.  Berlin Hun Grossman-Orr 12/14/2019, 10:03 AM

## 2019-12-14 NOTE — Progress Notes (Signed)
  [facility] Adult Case Management Discharge Plan :  Will you be returning to the same living situation after discharge:  Yes,  with father At discharge, do you have transportation home?: Yes,  father Do you have the ability to pay for your medications: Yes,  Medicaid  Release of information consent forms completed and emailed to Medical Records, then turned in to Medical Records by CSW.   Patient to Follow up at: Follow-up [place] to.   Specialty: Professional Counselor Why: Walk-in hours are Monday-Friday from 8:00am-5:00pm. Please be sure to follow up within 3-5 days of discharge to establish services. Be sure to bring any discharge paperwork from this hospitalization, including list of medications.  Contact information: [facility] [place] [phone]           Next level of care provider has access to [place] and Suicide Prevention discussed: Yes,  with father     Has patient been referred to the Quitline?: N/A patient is not a smoker  Patient has been referred for addiction treatment: Yes  Maretta Los, LCSW 12/14/2019, 9:53 AM

## 2019-12-14 NOTE — BHH Suicide Risk Assessment (Signed)
[facility] Discharge Suicide Risk Assessment   Principal Problem: MDD (major depressive disorder), recurrent episode, severe ([place]) Discharge Diagnoses: Principal Problem:   MDD (major depressive disorder), recurrent episode, severe ([place]) Active Problems:   MDD (major depressive disorder)   Major depression   Total Time spent with patient: 20 minutes  Musculoskeletal: Strength & Muscle Tone: within normal limits Gait & Station: normal Patient leans: N/A  Psychiatric Specialty Exam: Review of Systems  All other systems reviewed and are negative.   Blood pressure 104/61, pulse 79, temperature 98.9 F (37.2 C), temperature source Oral, resp. rate 16, height 5\' 5"  (1.651 m), weight 59 kg, SpO2 99 %.Body mass index is 21.63 kg/m.  General Appearance: Casual  Eye Contact::  Fair  Speech:  Normal Rate409  Volume:  Decreased  Mood:  Anxious  Affect:  Congruent  Thought Process:  Coherent and Descriptions of Associations: Intact  Orientation:  Full (Time, Place, and Person)  Thought Content:  Logical  Suicidal Thoughts:  No  Homicidal Thoughts:  No  Memory:  Immediate;   Good Recent;   Good Remote;   Good  Judgement:  Intact  Insight:  Fair  Psychomotor Activity:  Normal  Concentration:  Good  Recall:  Good  Fund of Knowledge:Good  Language: Good  Akathisia:  Negative  Handed:  Right  AIMS (if indicated):     Assets:  Communication Skills Desire for Improvement Housing Resilience Social Support  Sleep:  Number of Hours: 6.5  Cognition: WNL  ADL's:  Intact   Mental Status Per Nursing Assessment::   On Admission:  Suicidal ideation indicated by patient, Self-harm behaviors, Intention to act on suicide plan, Suicide plan, Plan includes specific time, place, or method, Self-harm thoughts  Demographic Factors:  Male, Adolescent or young adult and Caucasian  Loss Factors: NA  Historical Factors: Impulsivity  Risk Reduction Factors:   Living with another person,  especially a relative, Positive social support and Positive coping skills or problem solving skills  Continued Clinical Symptoms:  Severe Anxiety and/or Agitation Depression:   Impulsivity  Cognitive Features That Contribute To Risk:  None    Suicide Risk:  Minimal: No identifiable suicidal ideation.  Patients presenting with no risk factors but with morbid ruminations; may be classified as minimal risk based on the severity of the depressive symptoms  Follow-up [place] to.   Specialty: Professional Counselor Why: Walk-in hours are Monday-Friday from 8:00am-5:00pm. Please be sure to follow up within 3-5 days of discharge to establish services. Be sure to bring any discharge paperwork from this hospitalizatio, including list of medications.  Contact information: [facility] [place] [phone]           Plan Of Care/Follow-up recommendations:  Activity:  ad lib  Sharma Covert, MD 12/14/2019, 8:44 AM

## 2019-12-14 NOTE — Progress Notes (Signed)
Patient ID: John Mcdaniel, male   DOB: 01-Dec-2001, 18 y.o.   MRN: [id number]  D: Pt alert and oriented on the unit.   A: Education, support, and encouragement provided. Discharge summary, medications and follow up appointments reviewed with pt. Suicide prevention resources provided, including "My 3 App." Pt's belongings in locker # 16 returned and belongings sheet signed.  R: Pt denies SI/HI, A/VH, pain, or any concerns at this time. Pt ambulatory on and off unit. Pt discharged to lobby.

## 2019-12-14 NOTE — Plan of Care (Signed)
  Problem: Education: Goal: Emotional status will improve Outcome: Progressing Goal: Mental status will improve Outcome: Progressing   Problem: Activity: Goal: Interest or engagement in activities will improve Outcome: Progressing Goal: Sleeping patterns will improve Outcome: Progressing   Problem: Safety: Goal: Periods of time without injury will increase Outcome: Progressing   Problem: Self-Concept: Goal: Level of anxiety will decrease Outcome: Progressing   Problem: Activity: Goal: Imbalance in normal sleep/wake cycle will improve Outcome: Progressing   Problem: Coping: Goal: Will verbalize feelings Outcome: Progressing

## 2019-12-14 NOTE — Discharge Summary (Signed)
Physician Discharge Summary Note  Patient:  John Mcdaniel is an 18 y.o., male  MRN:  [id number]  DOB:  2001-10-07  Patient phone:  [phone] (home)   Patient address:   [address] [place] [address],   Total Time spent with patient: Greater than 30 minutes  Date of Admission:  12/12/2019  Date of Discharge: 12-14-19  Reason for Admission: Intentional overdose of approximately 1,500 mg of fluoxetine.   Principal Problem: MDD (major depressive disorder), recurrent episode, severe ([place])  Discharge Diagnoses: Principal Problem:   MDD (major depressive disorder), recurrent episode, severe ([place]) Active Problems:   MDD (major depressive disorder)   Major depression  Past Psychiatric History: Major depressive disorder, recurrent.  Past Medical History: History reviewed. No pertinent past medical history. History reviewed. No pertinent surgical history.  Family History: History reviewed. No pertinent family history.  Family Psychiatric  History: See H&P  Social History:  Social History   Substance and Sexual Activity  Alcohol Use No     Social History   Substance and Sexual Activity  Drug Use No    Social History   Socioeconomic History  . Marital status: Single    Spouse name: Not on file  . Number of children: Not on file  . Years of education: Not on file  . Highest education level: Not on file  Occupational History  . Not on file  Tobacco Use  . Smoking status: Never Smoker  . Smokeless tobacco: Never Used  Substance and Sexual Activity  . Alcohol use: No  . Drug use: No  . Sexual activity: Not Currently  Other Topics Concern  . Not on file  Social History Narrative  . Not on file   Social Determinants of Health   Financial Resource Strain:   . Difficulty of Paying Living Expenses: Not on file  Food Insecurity:   . Worried About Charity fundraiser in the Last Year: Not on file  . Ran Out of Food in the Last Year: Not on file   Transportation Needs:   . Lack of Transportation (Medical): Not on file  . Lack of Transportation (Non-Medical): Not on file  Physical Activity:   . Days of Exercise per Week: Not on file  . Minutes of Exercise per Session: Not on file  Stress:   . Feeling of Stress : Not on file  Social Connections:   . Frequency of Communication with Friends and Family: Not on file  . Frequency of Social Gatherings with Friends and Family: Not on file  . Attends Religious Services: Not on file  . Active Member of Clubs or Organizations: Not on file  . Attends Archivist Meetings: Not on file  . Marital Status: Not on file   Hospital Course: (Per Md's admission evaluation): Patient is a 18 year old male with a reported past psychiatric history significant for major depression who presented to the [facility] emergency department on 12/11/2019 after an intentional overdose of approximately 1500 mg of fluoxetine. The patient stated that he had been doing relatively well recently, but got his grades from college the day prior to this event. He had failed 2 classes, and was upset over the fact that he was going to have to repeat those classes. The notes from his father suggested that he was just doing fine and then the event occurred rather randomly and acutely. He stated that he had not had any recent suicidal thoughts prior to this event. He  did admit that in the past he had some burning behaviors, but had never tried to kill himself in the past. It was also reported that his mother had a completed suicide in the past.  Carrson was admitted to the hospital for worsening symptoms of depression & crisis management due to suicide attempt by overdose on Prozac capsules. He does have hx of chronic depression & positive family hx of completed suicide (mother) in 2011. Patient reported on admission that after taking the overdose, he went to his father & informed him of what he had done.  He was brought to the hospital for evaluation & treatment.   After his admission assessment, Derold was recommended for mood stabilization treatments. The medication regimen targeting those symptoms those presenting symptoms were discussed &  Initiated with his consent. He was medicated, stabilized & was discharged the medications as listed below on his discharge medication lists. He presented no other pre-existing medical problems that required treatment. He tolerated his treatment regimen without any adverse effects or reactions reported. Tradarius was enrolled & participated in the group counseling sessions being offered & held on this unit. He learned coping skills..   During the course of his hospitalization, the 15-minute checks were adequate to ensure Lawerence's safety.  Patient did not display any dangerous violent or suicidal behavior on the unit.  He interacted with patients & staff appropriately, participated appropriately in the group sessions/therapies. His medications were addressed & adjusted to meet his needs. he was recommended for outpatient follow-up care & medication management upon discharge to assure continuity of care.  At the time of discharge patient is not reporting any acute suicidal/homicidal ideations. He feels more confident about his self-care & in managing the suicidal thoughts. He currently denies any new issues or concerns. Education and supportive counseling provided throughout her hospital stay & upon discharge.  Today upon his discharge evaluation with the attending psychiatrist, Kewon shares he is doing well. He denies any other specific concerns. He is sleeping well. His appetite is good. He denies other physical complaints. He denies AH/VH. He feels that his medications have been helpful & is in agreement to continue his current treatment regimen. He was able to engage in safety planning including plan to return to [facility] or contact emergency services if he feels unable to  maintain his own safety or the safety of others. Pt had no further questions, comments, or concerns. He left [facility] with all personal belongings in no apparent distress. Transportation per family (father).  Physical Findings: AIMS:  , ,  ,  ,    CIWA:    COWS:     Musculoskeletal: Strength & Muscle Tone: within normal limits Gait & Station: normal Patient leans: N/A  Psychiatric Specialty Exam: Physical Exam  Nursing note and vitals reviewed. Constitutional: He is oriented to person, place, and time. He appears well-developed.  Eyes: Pupils are equal, round, and reactive to light.  Cardiovascular: Normal rate.  Respiratory: Effort normal.  Genitourinary:    Genitourinary Comments: Deferred   Musculoskeletal:        General: Normal range of motion.     Cervical back: Normal range of motion.  Neurological: He is alert and oriented to person, place, and time.  Skin: Skin is warm and dry.    Review of Systems  Constitutional: Negative for chills and fever.  HENT: Negative for congestion, rhinorrhea, sneezing and sore throat.   Respiratory: Negative for cough, shortness of breath and wheezing.   Cardiovascular: Negative  for chest pain and palpitations.  Gastrointestinal: Negative for diarrhea, nausea and vomiting.  Musculoskeletal: Negative for myalgias.  Skin: Negative for color change.  Neurological: Negative for seizures and numbness.  Psychiatric/Behavioral: Negative for agitation, behavioral problems, confusion, decreased concentration, dysphoric mood, hallucinations, self-injury, sleep disturbance and suicidal ideas. The patient is not nervous/anxious and is not hyperactive.     Blood pressure 104/61, pulse 79, temperature 98.9 F (37.2 C), temperature source Oral, resp. rate 16, height 5\' 5"  (1.651 m), weight 59 kg, SpO2 99 %.Body mass index is 21.63 kg/m.  See Md's discharge SRA     Has this patient used any form of tobacco in the last 30 days? (Cigarettes, Smokeless  Tobacco, Cigars, and/or Pipes): N/A  Blood Alcohol level:  Lab Results  Component Value Date   ETH <10 12/11/2019   Metabolic Disorder Labs:  Lab Results  Component Value Date   HGBA1C 5.1 12/13/2019   MPG 99.67 12/13/2019   No results found for: PROLACTIN Lab Results  Component Value Date   CHOL 149 12/13/2019   TRIG 70 12/13/2019   HDL 48 12/13/2019   CHOLHDL 3.1 12/13/2019   VLDL 14 12/13/2019   LDLCALC 87 12/13/2019   See Psychiatric Specialty Exam and Suicide Risk Assessment completed by Attending Physician prior to discharge.  Discharge destination:  Home  Is patient on multiple antipsychotic therapies at discharge:  No   Has Patient had three or more failed trials of antipsychotic monotherapy by history:  No  Recommended Plan for Multiple Antipsychotic Therapies: NA  Discharge Instructions    Diet - low sodium heart healthy   Complete by: As directed    Increase activity slowly   Complete by: As directed      Allergies as of 12/14/2019      Reactions   Other Other (See Comments)   Tree nuts   Peanuts [peanut Oil] Other (See Comments)   Unknown      Medication List    STOP taking these medications   FLUoxetine 40 MG capsule Commonly known as: PROZAC   PRESCRIPTION MEDICATION     TAKE these medications     Indication  cariprazine capsule Commonly known as: VRAYLAR Take 1 capsule (1.5 mg total) by mouth daily. For mood control What changed:   when to take this  additional instructions  Indication: Major Depressive Disorder, Mood control   hydrOXYzine 25 MG tablet Commonly known as: ATARAX/VISTARIL Take 1 tablet (25 mg total) by mouth 3 (three) times daily as needed for anxiety.  Indication: Feeling Anxious   traZODone 50 MG tablet Commonly known as: DESYREL Take 1 tablet (50 mg total) by mouth at bedtime as needed for sleep.  Indication: Trouble Sleeping      Follow-up Information    [facility] [place] Go to.    Specialty: Professional Counselor Why: Walk-in hours are Monday-Friday from 8:00am-5:00pm. Please be sure to follow up within 3-5 days of discharge to establish services. Be sure to bring any discharge paperwork from this hospitalizatio, including list of medications.  Contact information: [facility] [address] [place] [id number] [phone]          Follow-up recommendations: Activity:  As tolerated Diet: As recommended by your primary care doctor. Keep all scheduled follow-up appointments as recommended.   Comments: Prescriptions given at discharge.  Patient agreeable to plan.  Given opportunity to ask questions.  Appears to feel comfortable with discharge denies any current suicidal or  homicidal thought. Patient is also instructed prior to discharge to: Take all medications as prescribed by his/her mental healthcare provider. Report any adverse effects and or reactions from the medicines to his/her outpatient provider promptly. Patient has been instructed & cautioned: To not engage in alcohol and or illegal drug use while on prescription medicines. In the event of worsening symptoms, patient is instructed to call the crisis hotline, 911 and or go to the nearest ED for appropriate evaluation and treatment of symptoms. To follow-up with his/her primary care provider for your other medical issues, concerns and or health care needs.  Signed: Armandina Stammer, NP, PMHNP, FNP-BC 12/14/2019, 8:46 AM

## 2019-12-14 NOTE — Progress Notes (Signed)
Patient ID: John Mcdaniel, male   DOB: 2001/01/16, 18 y.o.   MRN: [id number]     [place] NOVEL CORONAVIRUS (COVID-19) DAILY CHECK-OFF SYMPTOMS - answer yes or no to each - every day NO YES  Have you had a fever in the past 24 hours?  . Fever (Temp > 37.80C / 100F) X   Have you had any of these symptoms in the past 24 hours? . New Cough .  Sore Throat  .  Shortness of Breath .  Difficulty Breathing .  Unexplained Body Aches   X   Have you had any one of these symptoms in the past 24 hours not related to allergies?   . Runny Nose .  Nasal Congestion .  Sneezing   X   If you have had runny nose, nasal congestion, sneezing in the past 24 hours, has it worsened?  X   EXPOSURES - check yes or no X   Have you traveled outside the state in the past 14 days?  X   Have you been in contact with someone with a confirmed diagnosis of COVID-19 or PUI in the past 14 days without wearing appropriate PPE?  X   Have you been living in the same home as a person with confirmed diagnosis of COVID-19 or a PUI (household contact)?    X   Have you been diagnosed with COVID-19?    X              What to do next: Answered NO to all: Answered YES to anything:   Proceed with unit schedule Follow the [facility] Inpatient Flowsheet.

## 2021-06-24 ENCOUNTER — Ambulatory Visit: Payer: Medicaid Other | Admitting: Registered"

## 2023-03-07 ENCOUNTER — Other Ambulatory Visit: Payer: Self-pay | Admitting: Family Medicine

## 2023-03-07 DIAGNOSIS — L989 Disorder of the skin and subcutaneous tissue, unspecified: Secondary | ICD-10-CM

## 2023-03-31 ENCOUNTER — Other Ambulatory Visit: Payer: Medicaid Other
